# Patient Record
Sex: Male | Born: 1970 | Race: Black or African American | Hispanic: No | Marital: Single | Smoking: Current some day smoker
Health system: Southern US, Community
[De-identification: ages and names within clinical notes are randomized; demographics above are authoritative.]

## PROBLEM LIST (undated history)

## (undated) DIAGNOSIS — T7840XA Allergy, unspecified, initial encounter: Secondary | ICD-10-CM

## (undated) DIAGNOSIS — K219 Gastro-esophageal reflux disease without esophagitis: Secondary | ICD-10-CM

## (undated) HISTORY — DX: Allergy, unspecified, initial encounter: T78.40XA

## (undated) HISTORY — DX: Gastro-esophageal reflux disease without esophagitis: K21.9

---

## 2001-11-03 ENCOUNTER — Inpatient Hospital Stay (HOSPITAL_COMMUNITY): Admission: EM | Admit: 2001-11-03 | Discharge: 2001-11-04 | Payer: Self-pay | Admitting: Psychiatry

## 2007-04-30 ENCOUNTER — Emergency Department (HOSPITAL_COMMUNITY): Admission: EM | Admit: 2007-04-30 | Discharge: 2007-04-30 | Payer: Self-pay | Admitting: Family Medicine

## 2008-11-13 ENCOUNTER — Emergency Department (HOSPITAL_COMMUNITY): Admission: EM | Admit: 2008-11-13 | Discharge: 2008-11-13 | Payer: Self-pay | Admitting: Emergency Medicine
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
## 2011-08-03 ENCOUNTER — Encounter (INDEPENDENT_AMBULATORY_CARE_PROVIDER_SITE_OTHER): Payer: Self-pay | Admitting: General Surgery

## 2011-08-04 ENCOUNTER — Ambulatory Visit (INDEPENDENT_AMBULATORY_CARE_PROVIDER_SITE_OTHER): Payer: Self-pay | Admitting: General Surgery

## 2011-08-04 ENCOUNTER — Other Ambulatory Visit (INDEPENDENT_AMBULATORY_CARE_PROVIDER_SITE_OTHER): Payer: Self-pay | Admitting: General Surgery

## 2011-08-04 ENCOUNTER — Ambulatory Visit (INDEPENDENT_AMBULATORY_CARE_PROVIDER_SITE_OTHER): Payer: BC Managed Care – PPO | Admitting: General Surgery

## 2011-08-04 ENCOUNTER — Encounter (INDEPENDENT_AMBULATORY_CARE_PROVIDER_SITE_OTHER): Payer: Self-pay | Admitting: General Surgery

## 2011-08-04 VITALS — BP 115/63 | HR 62 | Temp 97.8°F | Ht 70.0 in | Wt 199.8 lb

## 2011-08-04 DIAGNOSIS — R2230 Localized swelling, mass and lump, unspecified upper limb: Secondary | ICD-10-CM

## 2011-08-04 DIAGNOSIS — R229 Localized swelling, mass and lump, unspecified: Secondary | ICD-10-CM

## 2011-08-04 MED ORDER — SULFAMETHOXAZOLE-TRIMETHOPRIM 800-160 MG PO TABS: 1.0000 | ORAL_TABLET | Freq: Two times a day (BID) | ORAL | Status: AC

## 2011-08-04 NOTE — Progress Notes (Signed)
Chief Complaint  Patient presents with  . Mass    under lt axill    HPI Warren Torres is a 40 y.o. male.  This patient is seen for evaluation of three-week history of enlarging left axillary mass. He first noticed this approximately 3 to go and states that it is increasing in size since then. The first thing he noticed with some discomfort in the area and then noticed the swelling. He has not had any recent infections. He denies any drainage or redness in the area he denies any fevers, night sweats, chills, or significant weight loss. He has lost approximately 10 pounds last year. He denies any family history of malignancy. He denies any family history of breast cancer. He was taking some protein supplements for muscle building but is no longer taking these. HPI  History reviewed. No pertinent past medical history.  History reviewed. No pertinent past surgical history.  Family History  Problem Relation Age of Onset  . Hypertension Mother     Social History History  Substance Use Topics  . Smoking status: Current Some Day Smoker    Types: Cigars  . Smokeless tobacco: Current User  . Alcohol Use: Yes    No Known Allergies  Current Outpatient Prescriptions  Medication Sig Dispense Refill  . acetaminophen (TYLENOL) 500 MG tablet Take 500 mg by mouth every 6 (six) hours as needed.        Marland Kitchen ibuprofen (ADVIL,MOTRIN) 200 MG tablet Take 200 mg by mouth every 6 (six) hours as needed.        . sulfamethoxazole-trimethoprim (SEPTRA DS) 800-160 MG per tablet Take 1 tablet by mouth 2 (two) times daily.  14 tablet  0    Review of Systems Review of Systems  Constitutional: Negative.   Eyes: Negative.   Respiratory: Negative.   Cardiovascular: Negative.   Gastrointestinal: Negative.   Genitourinary: Negative.   Musculoskeletal: Negative.   Skin: Negative.   Neurological: Positive for headaches.  Endo/Heme/Allergies: Negative.   Psychiatric/Behavioral: Negative.     Blood pressure  115/63, pulse 62, temperature 97.8 F (36.6 C), temperature source Temporal, height 5\' 10"  (1.778 m), weight 199 lb 12.8 oz (90.629 kg).  Physical Exam Physical Exam  Constitutional: He is oriented to person, place, and time. He appears well-developed and well-nourished. No distress.  HENT:  Head: Normocephalic and atraumatic.  Eyes: Conjunctivae and EOM are normal. Pupils are equal, round, and reactive to light. Right eye exhibits no discharge. Left eye exhibits no discharge. No scleral icterus.  Neck: Normal range of motion. Neck supple. No tracheal deviation present. No thyromegaly present.  Cardiovascular: Normal rate, regular rhythm and normal heart sounds.   Respiratory: Effort normal and breath sounds normal. No stridor. No respiratory distress. He has no wheezes.  GI: Soft. Bowel sounds are normal. He exhibits no distension and no mass. There is no tenderness. There is no rebound and no guarding.  Musculoskeletal: Normal range of motion. He exhibits edema.  Lymphadenopathy:       Head (right side): No submental, no submandibular, no tonsillar, no preauricular, no posterior auricular and no occipital adenopathy present.       Head (left side): No submental, no submandibular, no tonsillar, no preauricular, no posterior auricular and no occipital adenopathy present.    He has no cervical adenopathy.    He has axillary adenopathy.       Right axillary: No pectoral and no lateral adenopathy present.       Left axillary: No  pectoral and no lateral adenopathy present.      Right: No inguinal, no supraclavicular and no epitrochlear adenopathy present.       Left: No inguinal, no supraclavicular and no epitrochlear adenopathy present.       Left axillary swelling with some palpable lymphadenopathy, No sign of erythema, or fluctuance or other sign of infection.  Left breast shows a nipple piercing as well as some palpable nodules in the retroareolar position at approximately 11:00 and 1:00 but  these do feel mobile and likely gynecomastia  Right breast is without suspicious nodules or skin changes he does have a right nipple piercing as well. No right axillary lymphadenopathy  Bedside ultrasound was performed using the high-frequency probe. Demonstrates normal-appearing breast tissue in the left breast without evidence of any hypoechoic nodules or masses. In the left axilla he has a 2 cm x 3 cm area of hypo-echoic tissue as well as some 8-9 mm lymph nodes in the region.  Neurological: He is alert and oriented to person, place, and time. He has normal reflexes.  Skin: Skin is warm and dry. No rash noted. He is not diaphoretic. No erythema. No pallor.  Psychiatric: He has a normal mood and affect. His behavior is normal. Judgment and thought content normal.     Data Reviewed   Assessment/Plan    Left axillary lymphadenopathy and left breast mass. I cannot tell if this is due to infection such as early axillary abscess although he has no skin changes consistent with this. This may also be due to primary axillary mass such as lymphoma versus reactive lymphadenopathy or lymphadenopathy from her primary breast problem. I have recommended formal left breast ultrasound as well as left axillary ultrasound to further characterize these lesions. And he will followup in 2 weeks for further evaluation. If there is any suspicious findings on ultrasound we will proceed with CT scan of the chest to further evaluate. Also I have prescribed a week of Bactrim to see if this modified size of the swelling. If this is due to infection then he should receive some improvement in his symptoms with antibiotic treatment. Certainly if this is due to malignancy and he will have no change with the antibiotic management. Again, he will follow up in 2 weeks after the ultrasound for further evaluation.            Lodema Pilot DAVID 08/04/2011, 10:08 AM

## 2011-08-10 ENCOUNTER — Other Ambulatory Visit (INDEPENDENT_AMBULATORY_CARE_PROVIDER_SITE_OTHER): Payer: Self-pay | Admitting: General Surgery

## 2011-08-10 ENCOUNTER — Ambulatory Visit
Admission: RE | Admit: 2011-08-10 | Discharge: 2011-08-10 | Disposition: A | Discharge status: Home / Self Care | Payer: BC Managed Care – PPO | Source: Ambulatory Visit | Attending: General Surgery | Admitting: General Surgery

## 2011-08-10 DIAGNOSIS — R2230 Localized swelling, mass and lump, unspecified upper limb: Secondary | ICD-10-CM

## 2011-08-19 ENCOUNTER — Encounter (INDEPENDENT_AMBULATORY_CARE_PROVIDER_SITE_OTHER): Payer: BC Managed Care – PPO | Admitting: General Surgery

## 2011-09-02 ENCOUNTER — Encounter (INDEPENDENT_AMBULATORY_CARE_PROVIDER_SITE_OTHER): Payer: Self-pay | Admitting: General Surgery

## 2011-12-20 ENCOUNTER — Encounter (HOSPITAL_COMMUNITY): Payer: Self-pay

## 2011-12-20 ENCOUNTER — Emergency Department (INDEPENDENT_AMBULATORY_CARE_PROVIDER_SITE_OTHER)
Admission: EM | Admit: 2011-12-20 | Discharge: 2011-12-20 | Disposition: A | Discharge status: Home / Self Care | Payer: BC Managed Care – PPO | Source: Home / Self Care | Attending: Emergency Medicine | Admitting: Emergency Medicine

## 2011-12-20 DIAGNOSIS — J329 Chronic sinusitis, unspecified: Secondary | ICD-10-CM

## 2011-12-20 DIAGNOSIS — K0510 Chronic gingivitis, plaque induced: Secondary | ICD-10-CM

## 2011-12-20 MED ORDER — ACETAMINOPHEN-CODEINE #3 300-30 MG PO TABS: 1.0000 | ORAL_TABLET | Freq: Four times a day (QID) | ORAL | Status: AC | PRN

## 2011-12-20 MED ORDER — AMOXICILLIN 500 MG PO CAPS: 500.0000 mg | ORAL_CAPSULE | Freq: Three times a day (TID) | ORAL | Status: AC

## 2011-12-20 NOTE — ED Provider Notes (Addendum)
History     CSN: [id number]  Arrival date & time 12/20/11  1840   First MD Initiated Contact with Patient 12/20/11 1849      Chief Complaint  Patient presents with  . Facial Pain    (Consider location/radiation/quality/duration/timing/severity/associated sxs/prior treatment) HPI Comments: SINUS  PRESSURE FOR 2 WEEKS " LAST WEEK BEEN  WORSE HAD A COLD LAST WEEK, WHATEVER BUG WAS AROUND"   Patient is a 41 y.o. male presenting with sinusitis. The history is provided by the patient.  Sinusitis  This is a new problem. Episode onset: 2 WEEKS. The problem has been gradually worsening. There has been no fever. Associated symptoms include sinus pressure and sore throat. Pertinent negatives include no chills, no congestion, no swollen glands, no cough and no shortness of breath.    History reviewed. No pertinent past medical history.  History reviewed. No pertinent past surgical history.  Family History  Problem Relation Age of Onset  . Hypertension Mother     History  Substance Use Topics  . Smoking status: Current Some Day Smoker    Types: Cigars  . Smokeless tobacco: Current User  . Alcohol Use: Yes      Review of Systems  Constitutional: Negative for fever, chills, diaphoresis and fatigue.  HENT: Positive for sore throat and sinus pressure. Negative for congestion, rhinorrhea and trouble swallowing.   Respiratory: Negative for cough and shortness of breath.     Allergies  Review of patient's allergies indicates no known allergies.  Home Medications   Current Outpatient Rx  Name Route Sig Dispense Refill  . ACETAMINOPHEN 500 MG PO TABS Oral Take 500 mg by mouth every 6 (six) hours as needed.      . ACETAMINOPHEN-CODEINE #3 300-30 MG PO TABS Oral Take 1-2 tablets by mouth every 6 (six) hours as needed for pain. 15 tablet 0  . AMOXICILLIN 500 MG PO CAPS Oral Take 1 capsule (500 mg total) by mouth 3 (three) times daily. 21 capsule 0  . IBUPROFEN 200 MG PO TABS Oral  Take 200 mg by mouth every 6 (six) hours as needed.        BP 124/79  Pulse 58  Temp(Src) 98.7 F (37.1 C) (Oral)  Resp 16  SpO2 100%  Physical Exam  Nursing note and vitals reviewed. Constitutional: He appears well-developed and well-nourished. No distress.  HENT:  Head: Atraumatic.  Right Ear: Tympanic membrane normal.  Left Ear: Tympanic membrane normal.  Nose: Right sinus exhibits maxillary sinus tenderness and frontal sinus tenderness. Left sinus exhibits maxillary sinus tenderness and frontal sinus tenderness.  Mouth/Throat: Uvula is midline. Abnormal dentition. Dental caries present.  Eyes: Conjunctivae are normal. Right eye exhibits no discharge. Left eye exhibits no discharge. No scleral icterus.  Neck: Normal range of motion. Neck supple. No JVD present. No thyromegaly present.  Cardiovascular: Normal rate.   Pulmonary/Chest: Effort normal and breath sounds normal. No respiratory distress.  Abdominal: Soft. Bowel sounds are normal. He exhibits no distension. There is tenderness. There is no guarding.    ED Course  Procedures (including critical care time)  Labs Reviewed - No data to display No results found.   1. Gingivitis   2. 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

## 2011-12-20 NOTE — ED Notes (Addendum)
C/o 6 months pain and pressure in face, eyes reddened, c/o nasty brown/grey junk when he coughs (non-smoker) Called his MD 2 days ago , and has a rx for flonase that he has yet to go get from pharmacy

## 2013-05-06 ENCOUNTER — Ambulatory Visit: Payer: Self-pay | Admitting: Family Medicine

## 2013-05-06 ENCOUNTER — Ambulatory Visit: Payer: Self-pay

## 2013-05-06 VITALS — BP 107/73 | HR 78 | Temp 97.9°F | Resp 16 | Ht 71.0 in | Wt 195.0 lb

## 2013-05-06 DIAGNOSIS — M25571 Pain in right ankle and joints of right foot: Secondary | ICD-10-CM

## 2013-05-06 DIAGNOSIS — S93409A Sprain of unspecified ligament of unspecified ankle, initial encounter: Secondary | ICD-10-CM

## 2013-05-06 DIAGNOSIS — M25579 Pain in unspecified ankle and joints of unspecified foot: Secondary | ICD-10-CM

## 2013-05-06 MED ORDER — HYDROCODONE-ACETAMINOPHEN 5-325 MG PO TABS: 1.0000 | ORAL_TABLET | Freq: Four times a day (QID) | ORAL | Status: AC | PRN

## 2013-05-06 MED ORDER — IBUPROFEN 800 MG PO TABS: 800.0000 mg | ORAL_TABLET | Freq: Three times a day (TID) | ORAL | Status: AC | PRN
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
## 2013-05-06 NOTE — Patient Instructions (Signed)
Acute Ankle Sprain  with Phase I Rehab  An acute ankle sprain is a partial or complete tear in one or more of the ligaments of the ankle due to traumatic injury. The severity of the injury depends on both the the number of ligaments sprained and the grade of sprain. There are 3 grades of sprains.   · A grade 1 sprain is a mild sprain. There is a slight pull without obvious tearing. There is no loss of strength, and the muscle and ligament are the correct length.  · A grade 2 sprain is a moderate sprain. There is tearing of fibers within the substance of the ligament where it connects two bones or two cartilages. The length of the ligament is increased, and there is usually decreased strength.  · A grade 3 sprain is a complete rupture of the ligament and is uncommon.  In addition to the grade of sprain, there are three types of ankle sprains.   Lateral ankle sprains: This is a sprain of one or more of the three ligaments on the outer side (lateral) of the ankle. These are the most common sprains.  Medial ankle sprains: There is one large triangular ligament of the inner side (medial) of the ankle that is susceptible to injury. Medial ankle sprains are less common.  Syndesmosis, "high ankle," sprains: The syndesmosis is the ligament that connects the two bones of the lower leg. Syndesmosis sprains usually only occur with very severe ankle sprains.  SYMPTOMS  · Pain, tenderness, and swelling in the ankle, starting at the side of injury that may progress to the whole ankle and foot with time.  · "Pop" or tearing sensation at the time of injury.  · Bruising that may spread to the heel.  · Impaired ability to walk soon after injury.  CAUSES   · Acute ankle sprains are caused by trauma placed on the ankle that temporarily forces or pries the anklebone (talus) out of its normal socket.  · Stretching or tearing of the ligaments that normally hold the joint in place (usually due to a twisting injury).  RISK INCREASES  WITH:  · Previous ankle sprain.  · Sports in which the foot may land awkwardly (ie. basketball, volleyball, or soccer) or walking or running on uneven or rough surfaces.  · Shoes with inadequate support to prevent sideways motion when stress occurs.  · Poor strength and flexibility.  · Poor balance skills.  · Contact sports.  PREVENTION   · Warm up and stretch properly before activity.  · Maintain physical fitness:  · Ankle and leg flexibility, muscle strength, and endurance.  · Cardiovascular fitness.  · Balance training activities.  · Use proper technique and have a coach correct improper technique.  · Taping, protective strapping, bracing, or high-top tennis shoes may help prevent injury. Initially, tape is best; however, it loses most of its support function within 10 to 15 minutes.  · Wear proper fitted protective shoes (High-top shoes with taping or bracing is more effective than either alone).  · Provide the ankle with support during sports and practice activities for 12 months following injury.  PROGNOSIS   · If treated properly, ankle sprains can be expected to recover completely; however, the length of recovery depends on the degree of injury.  · A grade 1 sprain usually heals enough in 5 to 7 days to allow modified activity and requires an average of 6 weeks to heal completely.  · A grade 2 sprain requires   6 to 10 weeks to heal completely.  · A grade 3 sprain requires 12 to 16 weeks to heal.  · A syndesmosis sprain often takes more than 3 months to heal.  RELATED COMPLICATIONS   · Frequent recurrence of symptoms may result in a chronic problem. Appropriately addressing the problem the first time decreases the frequency of recurrence and optimizes healing time. Severity of the initial sprain does not predict the likelihood of later instability.  · Injury to other structures (bone, cartilage, or tendon).  · A chronically unstable or arthritic ankle joint is a possiblity with repeated  sprains.  TREATMENT  Treatment initially involves the use of ice, medication, and compression bandages to help reduce pain and inflammation. Ankle sprains are usually immobilized in a walking cast or boot to allow for healing. Crutches may be recommended to reduce pressure on the injury. After immobilization, strengthening and stretching exercises may be necessary to regain strength and a full range of motion. Surgery is rarely needed to treat ankle sprains.  MEDICATION   · Nonsteroidal anti-inflammatory medications, such as aspirin and ibuprofen (do not take for the first 3 days after injury or within 7 days before surgery), or other minor pain relievers, such as acetaminophen, are often recommended. Take these as directed by your caregiver. Contact your caregiver immediately if any bleeding, stomach upset, or signs of an allergic reaction occur from these medications.  · Ointments applied to the skin may be helpful.  · Pain relievers may be prescribed as necessary by your caregiver. Do not take prescription pain medication for longer than 4 to 7 days. Use only as directed and only as much as you need.  HEAT AND COLD  · Cold treatment (icing) is used to relieve pain and reduce inflammation for acute and chronic cases. Cold should be applied for 10 to 15 minutes every 2 to 3 hours for inflammation and pain and immediately after any activity that aggravates your symptoms. Use ice packs or an ice massage.  · Heat treatment may be used before performing stretching and strengthening activities prescribed by your caregiver. Use a heat pack or a warm soak.  SEEK IMMEDIATE MEDICAL CARE IF:   · Pain, swelling, or bruising worsens despite treatment.  · You experience pain, numbness, discoloration, or coldness in the foot or toes.  · New, unexplained symptoms develop (drugs used in treatment may produce side effects.)  EXERCISES   PHASE I EXERCISES  RANGE OF MOTION (ROM) AND STRETCHING EXERCISES - Ankle Sprain, Acute Phase I,  Weeks 1 to 2  These exercises may help you when beginning to restore flexibility in your ankle. You will likely work on these exercises for the 1 to 2 weeks after your injury. Once your physician, physical therapist, or athletic trainer sees adequate progress, he or she will advance your exercises. While completing these exercises, remember:   · Restoring tissue flexibility helps normal motion to return to the joints. This allows healthier, less painful movement and activity.  · An effective stretch should be held for at least 30 seconds.  · A stretch should never be painful. You should only feel a gentle lengthening or release in the stretched tissue.  RANGE OF MOTION - Dorsi/Plantar Flexion  · While sitting with your right / left knee straight, draw the top of your foot upwards by flexing your ankle. Then reverse the motion, pointing your toes downward.  · Hold each position for __________ seconds.  · After completing your first set of   exercises, repeat this exercise with your knee bent.  Repeat __________ times. Complete this exercise __________ times per day.   RANGE OF MOTION - Ankle Alphabet  · Imagine your right / left big toe is a pen.  · Keeping your hip and knee still, write out the entire alphabet with your "pen." Make the letters as large as you can without increasing any discomfort.  Repeat __________ times. Complete this exercise __________ times per day.   STRENGTHENING EXERCISES - Ankle Sprain, Acute -Phase I, Weeks 1 to 2  These exercises may help you when beginning to restore strength in your ankle. You will likely work on these exercises for 1 to 2 weeks after your injury. Once your physician, physical therapist, or athletic trainer sees adequate progress, he or she will advance your exercises. While completing these exercises, remember:   · Muscles can gain both the endurance and the strength needed for everyday activities through controlled exercises.  · Complete these exercises as instructed by  your physician, physical therapist, or athletic trainer. Progress the resistance and repetitions only as guided.  · You may experience muscle soreness or fatigue, but the pain or discomfort you are trying to eliminate should never worsen during these exercises. If this pain does worsen, stop and make certain you are following the directions exactly. If the pain is still present after adjustments, discontinue the exercise until you can discuss the trouble with your clinician.  STRENGTH - Dorsiflexors  · Secure a rubber exercise band/tubing to a fixed object (ie. table, pole) and loop the other end around your right / left foot.  · Sit on the floor facing the fixed object. The band/tubing should be slightly tense when your foot is relaxed.  · Slowly draw your foot back toward you using your ankle and toes.  · Hold this position for __________ seconds. Slowly release the tension in the band and return your foot to the starting position.  Repeat __________ times. Complete this exercise __________ times per day.   STRENGTH - Plantar-flexors   · Sit with your right / left leg extended. Holding onto both ends of a rubber exercise band/tubing, loop it around the ball of your foot. Keep a slight tension in the band.  · Slowly push your toes away from you, pointing them downward.  · Hold this position for __________ seconds. Return slowly, controlling the tension in the band/tubing.  Repeat __________ times. Complete this exercise __________ times per day.   STRENGTH - Ankle Eversion  · Secure one end of a rubber exercise band/tubing to a fixed object (table, pole). Loop the other end around your foot just before your toes.  · Place your fists between your knees. This will focus your strengthening at your ankle.  · Drawing the band/tubing across your opposite foot, slowly, pull your little toe out and up. Make sure the band/tubing is positioned to resist the entire motion.  · Hold this position for __________ seconds.  Have  your muscles resist the band/tubing as it slowly pulls your foot back to the starting position.   Repeat __________ times. Complete this exercise __________ times per day.   STRENGTH - Ankle Inversion  · Secure one end of a rubber exercise band/tubing to a fixed object (table, pole). Loop the other end around your foot just before your toes.  · Place your fists between your knees. This will focus your strengthening at your ankle.  · Slowly, pull your big toe up and in, making   sure the band/tubing is positioned to resist the entire motion.  · Hold this position for __________ seconds.  · Have your muscles resist the band/tubing as it slowly pulls your foot back to the starting position.  Repeat __________ times. Complete this exercises __________ times per day.   STRENGTH - Towel Curls  · Sit in a chair positioned on a non-carpeted surface.  · Place your right / left foot on a towel, keeping your heel on the floor.  · Pull the towel toward your heel by only curling your toes. Keep your heel on the floor.  · If instructed by your physician, physical therapist, or athletic trainer, add weight to the end of the towel.  Repeat __________ times. Complete this exercise __________ times per day.  Document Released: 06/23/2005 Document Revised: 02/14/2012 Document Reviewed: 03/06/2009  [employer]® Patient Information ©2014 [employer].

## 2019-10-12 ENCOUNTER — Other Ambulatory Visit: Payer: Self-pay | Admitting: Internal Medicine

## 2019-10-12 DIAGNOSIS — Z13220 Encounter for screening for lipoid disorders: Secondary | ICD-10-CM | POA: Diagnosis not present

## 2019-10-12 DIAGNOSIS — J309 Allergic rhinitis, unspecified: Secondary | ICD-10-CM | POA: Diagnosis not present

## 2019-10-12 DIAGNOSIS — N62 Hypertrophy of breast: Secondary | ICD-10-CM

## 2019-10-12 DIAGNOSIS — Z0000 Encounter for general adult medical examination without abnormal findings: Secondary | ICD-10-CM | POA: Diagnosis not present

## 2019-10-23 ENCOUNTER — Other Ambulatory Visit: Payer: Self-pay

## 2019-10-30 ENCOUNTER — Other Ambulatory Visit: Payer: Self-pay

## 2019-11-09 ENCOUNTER — Ambulatory Visit: Payer: Self-pay

## 2019-11-09 ENCOUNTER — Ambulatory Visit
Admission: RE | Admit: 2019-11-09 | Discharge: 2019-11-09 | Disposition: A | Discharge status: Home / Self Care | Payer: BC Managed Care – PPO | Source: Ambulatory Visit | Attending: Internal Medicine | Admitting: Internal Medicine

## 2019-11-09 ENCOUNTER — Other Ambulatory Visit: Payer: Self-pay | Admitting: Internal Medicine

## 2019-11-09 ENCOUNTER — Other Ambulatory Visit: Payer: Self-pay

## 2019-11-09 DIAGNOSIS — R2232 Localized swelling, mass and lump, left upper limb: Secondary | ICD-10-CM

## 2019-11-09 DIAGNOSIS — N62 Hypertrophy of breast: Secondary | ICD-10-CM

## 2019-11-09 DIAGNOSIS — R928 Other abnormal and inconclusive findings on diagnostic imaging of breast: Secondary | ICD-10-CM | POA: Diagnosis not present

## 2020-01-04 ENCOUNTER — Other Ambulatory Visit: Payer: Self-pay | Admitting: Surgery

## 2020-01-04 ENCOUNTER — Ambulatory Visit: Payer: Self-pay | Admitting: Surgery

## 2020-01-04 DIAGNOSIS — N62 Hypertrophy of breast: Secondary | ICD-10-CM | POA: Diagnosis not present

## 2020-01-04 NOTE — H&P (Signed)
History of Present Illness Warren Torres. Warren Hassan MD; 01/04/2020 12:09 PM) The patient is a 49 year old male who presents with gynecomastia. Referred by Dr. Chales Torres for bilateral gynecomastia  This is a 49 year old male who presents with several years of visible gynecomastia but recent noticeable enlargement bilaterally as well as associated tenderness. When the patient was younger he used to work out aggressively and also used anabolic steroids. He is no longer use of steroids and actually does not work out much anymore. Over the last several months he has noticed enlargement of both breasts as well as significant associated tenderness. He is actually unable to sleep on his chest. The underwent imaging that showed normal appearing breast tissue with no discrete mass. The patient does admit to frequent headaches. He also has history of frequent nosebleeds but these have been less common recently. He also feels that his vision is deteriorating.  In 2012, he underwent core biopsy of an enlarged left axillary lymph node. 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  CLINICAL DATA: Patient reports palpable tender abnormalities in the retroareolar regions bilaterally, increasing in frequency over the last several months. There is also questioned nodularity in the LEFT axilla on recent physical exam. 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  EXAM: DIGITAL DIAGNOSTIC BILATERAL MAMMOGRAM WITH CAD AND TOMO  ULTRASOUND LEFT BREAST  COMPARISON: LEFT diagnostic mammogram performed 08/10/2011  ACR Breast Density Category b: There are scattered areas of fibroglandular density.  FINDINGS: Bilaterally, symmetric there is mild retroareolar fibroglandular tissue in appearance. No mass, distortion, or suspicious microcalcifications. Evaluation of the LEFT axilla is negative for adenopathy  mammographically.  Mammographic images were processed with CAD.  On physical exam, I palpate no discrete mass in the LEFT axilla. I palpate soft fullness in the retroareolar regions bilaterally, not associated with mass.  Targeted ultrasound is performed, showing normal appearing LEFT axillary contents. No adenopathy identified.  IMPRESSION: 1. Bilateral symmetric gynecomastia. This is causing the patient moderate discomfort. We discussed the option of surgical consultation regarding possible mastectomy. The patient is interested in consultation, and this will be arranged. 2. No evidence for adenopathy or malignancy.  RECOMMENDATION: 1. Clinical follow-up as needed. 2. Surgical consultation to discuss options for gynecomastia.  I have discussed the findings and recommendations with the patient. If applicable, a reminder letter will be sent to the patient regarding the next appointment.  BI-RADS CATEGORY 2: Benign.  Electronically Signed: By: Warren Torres M.D. On: 11/09/2019 16:32   Past Surgical History Warren Torres, CMA; 01/04/2020 10:03 AM) No pertinent past surgical history  Diagnostic Studies History Warren Torres, [place]; 01/04/2020 10:03 AM) Colonoscopy never  Allergies Warren Torres, CMA; 01/04/2020 10:04 AM) No Known Drug Allergies [01/04/2020]: Allergies Reconciled  Medication History Warren Torres, CMA; 01/04/2020 10:04 AM) No Current Medications Medications Reconciled  Social History Warren Torres, [place]; 01/04/2020 10:03 AM) Alcohol use Occasional alcohol use. Tobacco use Current some day smoker.  Family History Warren Torres, [place]; 01/04/2020 10:03 AM) Cancer Sister. Hypertension Mother.     Review of Systems Warren Torres CMA; 01/04/2020 10:03 AM) Skin Not Present- Change in Wart/Mole, Dryness, Hives, Jaundice, New Lesions, Non-Healing Wounds, Rash and Ulcer. HEENT Not Present- Earache, Hearing Loss, Hoarseness, Nose  Bleed, Oral Ulcers, Ringing in the Ears, Seasonal Allergies, Sinus Pain, Sore Throat, Visual Disturbances, Wears glasses/contact lenses and Yellow Eyes. Breast Present- Breast Mass and Breast Pain. Not Present- Nipple Discharge and Skin Changes. Cardiovascular Present- Chest  Pain. Not Present- Difficulty Breathing Lying Down, Leg Cramps, Palpitations, Rapid Heart Rate, Shortness of Breath and Swelling of Extremities. Gastrointestinal Not Present- Abdominal Pain, Bloating, Bloody Stool, Change in Bowel Habits, Chronic diarrhea, Constipation, Difficulty Swallowing, Excessive gas, Gets full quickly at meals, Hemorrhoids, Indigestion, Nausea, Rectal Pain and Vomiting. Male Genitourinary Not Present- Blood in Urine, Change in Urinary Stream, Frequency, Impotence, Nocturia, Painful Urination, Urgency and Urine Leakage.  Vitals Warren Torres CMA; 01/04/2020 10:04 AM) 01/04/2020 10:03 AM Weight: 190.8 lb Height: 68in Body Surface Area: 2 m Body Mass Index: 29.01 kg/m  Temp.: 97.47F  Pulse: 81 (Regular)  BP: 126/80 (Sitting, Left Arm, Standard)        Physical Exam Warren Key K. Aroldo Galli MD; 01/04/2020 12:10 PM)  The physical exam findings are as follows: Note:WDWN in NAD Eyes: Pupils equal, round; sclera anicteric HENT: Oral mucosa moist; good dentition Neck: No masses palpated, no thyromegaly Lungs: CTA bilaterally; normal respiratory effort Chest: obvious bilateral gynecomastia - left side larger than right. Tender to palpation diffusely. No discrete masses. No nipple discharge. No axillary lymphadenopathy CV: Regular rate and rhythm; no murmurs; extremities well-perfused with no edema Abd: +bowel sounds, soft, non-tender, no palpable organomegaly; no palpable hernias Skin: Warm, dry; no sign of jaundice Psychiatric - alert and oriented x 4; calm mood and affect    Assessment & Plan Warren Key K. Jaxon Flatt MD; 01/04/2020 12:12 PM)  GYNECOMASTIA (N62) Impression: Bilateral  enlarging, tender  Current Plans CAT SCAN OF HEAD/BRAIN W/O DYE (29518) Schedule for Surgery - if the head CT is negative for a prolactinoma, we will proceed with scheduling bilateral subcutaneous mastectomies. The surgical procedure has been discussed with the patient. Potential risks, benefits, alternative treatments, and expected outcomes have been explained. All of the patient's questions at this time have been answered. The likelihood of reaching the patient's treatment goal is good. The patient understand the proposed surgical procedure and wishes to proceed. Note:With the enlarging bilateral tender gynecomastia, as well as the frequent headaches, history of frequent nosebleeds, and visual deterioration, I would first like to obtain a CT scan to rule out a pituitary tumor. If this is negative, we will proceed with bilateral subcutaneous mastectomies. I believe this is indicated because of the enlarging size and the diffuse tenderness. This is not a purely cosmetic procedure. I went ahead and discussed the surgical procedure with the patient but we will not schedule surgery until we have the results of the CT scan.  Imogene Burn. Georgette Dover, MD, [facility]  General/ Trauma Surgery   01/04/2020 12:12 PM

## 2020-01-16 ENCOUNTER — Ambulatory Visit
Admission: RE | Admit: 2020-01-16 | Discharge: 2020-01-16 | Disposition: A | Discharge status: Home / Self Care | Payer: BC Managed Care – PPO | Source: Ambulatory Visit | Attending: Surgery | Admitting: Surgery

## 2020-01-16 DIAGNOSIS — R519 Headache, unspecified: Secondary | ICD-10-CM | POA: Diagnosis not present

## 2020-01-16 DIAGNOSIS — N62 Hypertrophy of breast: Secondary | ICD-10-CM

## 2020-01-17 ENCOUNTER — Ambulatory Visit: Payer: Self-pay | Admitting: Surgery

## 2020-01-17 NOTE — Progress Notes (Signed)
Please call the patient and let them know that their CT scan showed no sign of pituitary tumor.  We can proceed with scheduling surgery.  I think the orders are in already.

## 2020-01-22 DIAGNOSIS — E785 Hyperlipidemia, unspecified: Secondary | ICD-10-CM | POA: Diagnosis not present

## 2020-02-05 DIAGNOSIS — R6882 Decreased libido: Secondary | ICD-10-CM | POA: Diagnosis not present

## 2020-02-05 DIAGNOSIS — R5383 Other fatigue: Secondary | ICD-10-CM | POA: Diagnosis not present

## 2020-02-05 DIAGNOSIS — E291 Testicular hypofunction: Secondary | ICD-10-CM | POA: Diagnosis not present

## 2020-02-05 DIAGNOSIS — E785 Hyperlipidemia, unspecified: Secondary | ICD-10-CM | POA: Diagnosis not present

## 2020-02-05 DIAGNOSIS — N644 Mastodynia: Secondary | ICD-10-CM | POA: Diagnosis not present

## 2020-02-05 DIAGNOSIS — J309 Allergic rhinitis, unspecified: Secondary | ICD-10-CM | POA: Diagnosis not present

## 2020-02-05 DIAGNOSIS — N62 Hypertrophy of breast: Secondary | ICD-10-CM | POA: Diagnosis not present

## 2020-02-06 DIAGNOSIS — R6882 Decreased libido: Secondary | ICD-10-CM | POA: Diagnosis not present

## 2020-02-06 DIAGNOSIS — R5383 Other fatigue: Secondary | ICD-10-CM | POA: Diagnosis not present

## 2020-02-06 DIAGNOSIS — N62 Hypertrophy of breast: Secondary | ICD-10-CM | POA: Diagnosis not present

## 2020-02-06 DIAGNOSIS — N644 Mastodynia: Secondary | ICD-10-CM | POA: Diagnosis not present

## 2020-03-26 DIAGNOSIS — N62 Hypertrophy of breast: Secondary | ICD-10-CM | POA: Diagnosis not present

## 2020-03-26 DIAGNOSIS — N644 Mastodynia: Secondary | ICD-10-CM | POA: Diagnosis not present

## 2020-10-06 DIAGNOSIS — N644 Mastodynia: Secondary | ICD-10-CM | POA: Diagnosis not present

## 2020-10-06 DIAGNOSIS — Z720 Tobacco use: Secondary | ICD-10-CM | POA: Diagnosis not present

## 2020-10-06 DIAGNOSIS — N528 Other male erectile dysfunction: Secondary | ICD-10-CM | POA: Diagnosis not present

## 2020-10-06 DIAGNOSIS — N62 Hypertrophy of breast: Secondary | ICD-10-CM | POA: Diagnosis not present

## 2020-10-17 DIAGNOSIS — Z13220 Encounter for screening for lipoid disorders: Secondary | ICD-10-CM | POA: Diagnosis not present

## 2020-10-17 DIAGNOSIS — Z0000 Encounter for general adult medical examination without abnormal findings: Secondary | ICD-10-CM | POA: Diagnosis not present

## 2020-11-14 DIAGNOSIS — F332 Major depressive disorder, recurrent severe without psychotic features: Secondary | ICD-10-CM | POA: Diagnosis not present

## 2021-04-17 DIAGNOSIS — Z20828 Contact with and (suspected) exposure to other viral communicable diseases: Secondary | ICD-10-CM | POA: Diagnosis not present

## 2021-08-25 DIAGNOSIS — M9903 Segmental and somatic dysfunction of lumbar region: Secondary | ICD-10-CM | POA: Diagnosis not present

## 2021-08-25 DIAGNOSIS — M545 Low back pain, unspecified: Secondary | ICD-10-CM | POA: Diagnosis not present

## 2021-08-25 DIAGNOSIS — M9902 Segmental and somatic dysfunction of thoracic region: Secondary | ICD-10-CM | POA: Diagnosis not present

## 2021-08-25 DIAGNOSIS — M9904 Segmental and somatic dysfunction of sacral region: Secondary | ICD-10-CM | POA: Diagnosis not present

## 2021-09-17 IMAGING — US US AXILLARY LEFT
1 series · 3 of 3 positions shown · non-contrast
Comparison: LEFT diagnostic mammogram performed 08/10/2011
COMPARISON: LEFT diagnostic mammogram performed 08/10/2011

Addendum:
CLINICAL DATA: Patient reports palpable tender abnormalities in the
retroareolar regions bilaterally, increasing in frequency over the
last several months. There is also questioned nodularity in the LEFT
axilla on recent physical exam. The patient's history of biopsy
proven necrotizing granulomatous lymphadenitis of the LEFT axilla in
5345.

EXAM:
DIGITAL DIAGNOSTIC BILATERAL MAMMOGRAM WITH CAD AND TOMO
ULTRASOUND LEFT BREAST

[Series 1: us axillary left · 0.06mm/px · 3 of 3 slices shown]
[im 1/3]
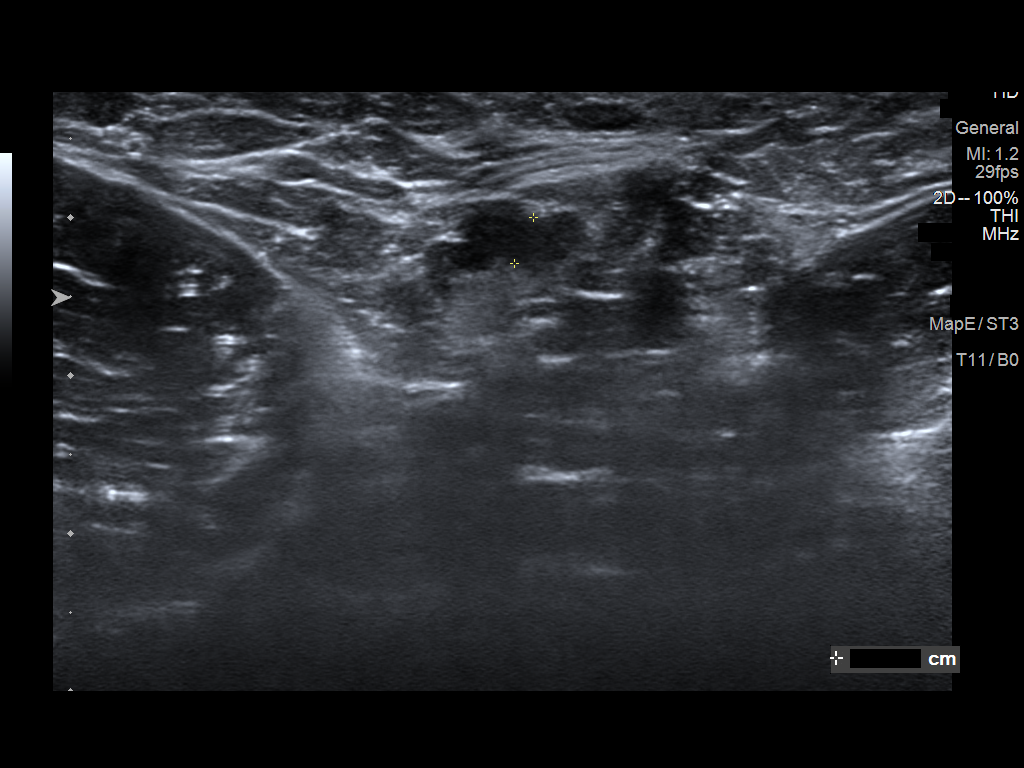
[im 2/3]
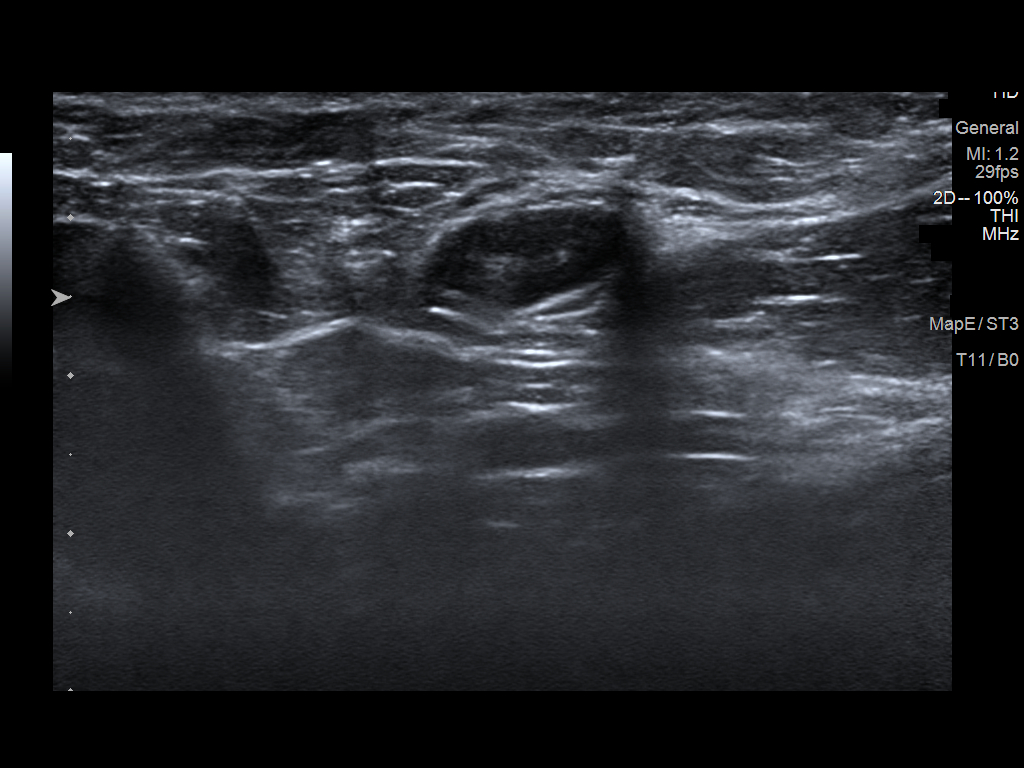
[im 3/3]
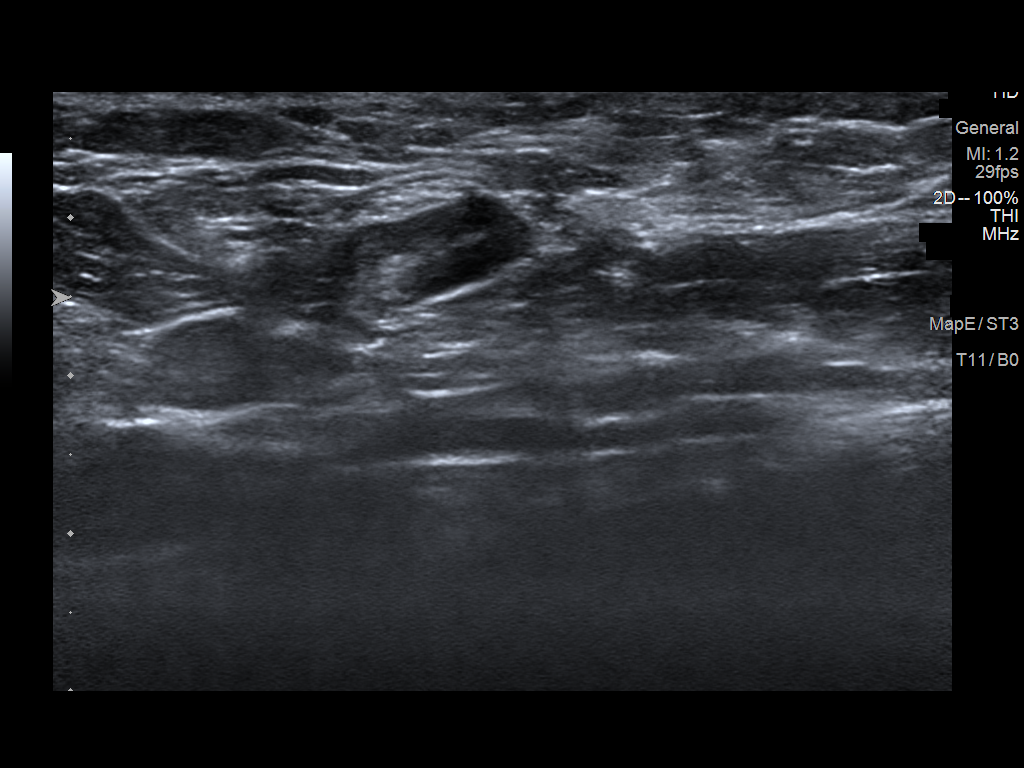

[3 of 3 positions shown; findings below may reference images not displayed]

ACR Breast Density Category b: There are scattered areas of
fibroglandular density.
FINDINGS: Bilaterally, symmetric there is mild retroareolar fibroglandular
tissue in appearance. No mass, distortion, or suspicious
microcalcifications. Evaluation of the LEFT axilla is negative for
adenopathy mammographically.

Mammographic images were processed with CAD.

On physical exam, I palpate no discrete mass in the LEFT axilla. I
palpate soft fullness in the retroareolar regions bilaterally, not
associated with mass.

Targeted ultrasound is performed, showing normal appearing LEFT
axillary contents. No adenopathy identified.
IMPRESSION: 1. Bilateral symmetric gynecomastia. This is causing the patient
moderate discomfort. We discussed the option of surgical
consultation regarding possible mastectomy. The patient is
interested in consultation, and this will be arranged.
2. No evidence for adenopathy or malignancy.

RECOMMENDATION:
1. Clinical follow-up as needed.
2. Surgical consultation to discuss options for gynecomastia.

I have discussed the findings and recommendations with the patient.
If applicable, a reminder letter will be sent to the patient
regarding the next appointment.

BI-RADS CATEGORY  2: Benign.

ADDENDUM:
Surgical consultation has been arranged with Dr. Nikunj Susanti at
[REDACTED] on December 14, 2019

Pozisa Tiger, RN on 11/12/2019.

*** End of Addendum ***
ACR Breast Density Category b: There are scattered areas of
fibroglandular density.
FINDINGS: Bilaterally, symmetric there is mild retroareolar fibroglandular
tissue in appearance. No mass, distortion, or suspicious
microcalcifications. Evaluation of the LEFT axilla is negative for
adenopathy mammographically.

Mammographic images were processed with CAD.

On physical exam, I palpate no discrete mass in the LEFT axilla. I
palpate soft fullness in the retroareolar regions bilaterally, not
associated with mass.

Targeted ultrasound is performed, showing normal appearing LEFT
axillary contents. No adenopathy identified.
IMPRESSION: 1. Bilateral symmetric gynecomastia. This is causing the patient
moderate discomfort. We discussed the option of surgical
consultation regarding possible mastectomy. The patient is
interested in consultation, and this will be arranged.
2. No evidence for adenopathy or malignancy.

RECOMMENDATION:
1. Clinical follow-up as needed.
2. Surgical consultation to discuss options for gynecomastia.

I have discussed the findings and recommendations with the patient.
If applicable, a reminder letter will be sent to the patient
regarding the next appointment.

BI-RADS CATEGORY  2: Benign.

## 2021-09-17 IMAGING — MG DIGITAL DIAGNOSTIC BILAT W/ TOMO W/ CAD
6 of 12 series · 6 of 36 positions shown · non-contrast
Comparison: LEFT diagnostic mammogram performed 08/10/2011
COMPARISON: LEFT diagnostic mammogram performed 08/10/2011

Addendum:
CLINICAL DATA: Patient reports palpable tender abnormalities in the
retroareolar regions bilaterally, increasing in frequency over the
last several months. There is also questioned nodularity in the LEFT
axilla on recent physical exam. The patient's history of biopsy
proven necrotizing granulomatous lymphadenitis of the LEFT axilla in
5345.

EXAM:
DIGITAL DIAGNOSTIC BILATERAL MAMMOGRAM WITH CAD AND TOMO
ULTRASOUND LEFT BREAST

[L MLO synth-2D]
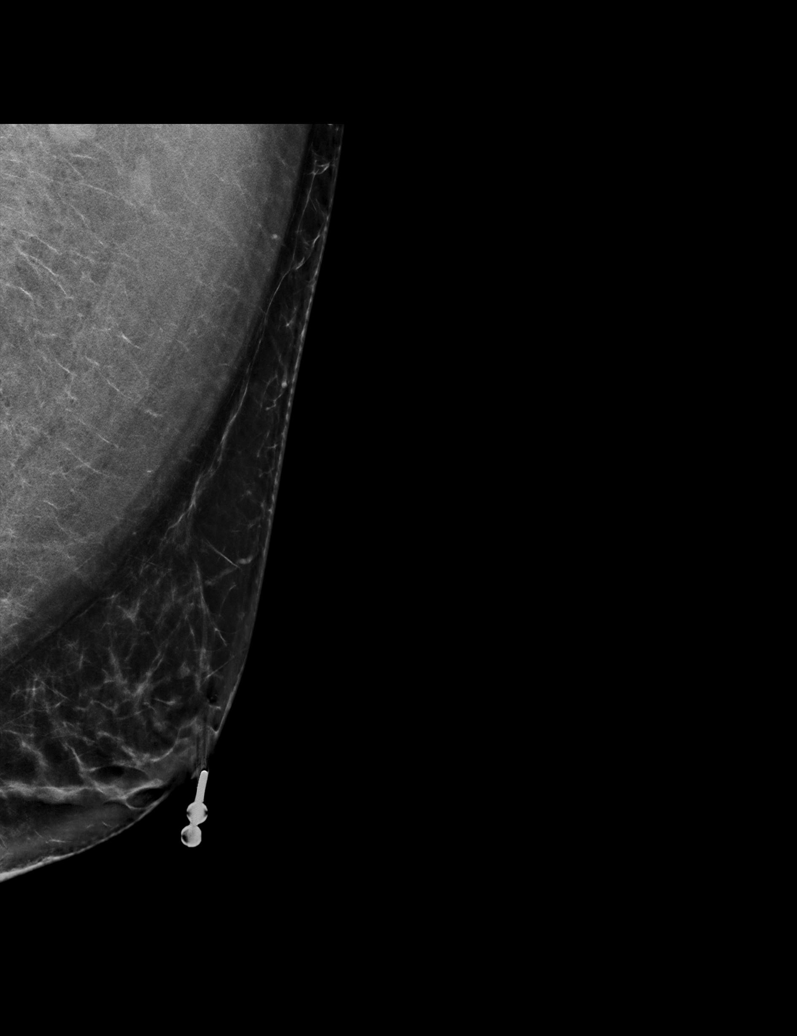

[R CC synth-2D (1 of 2)]
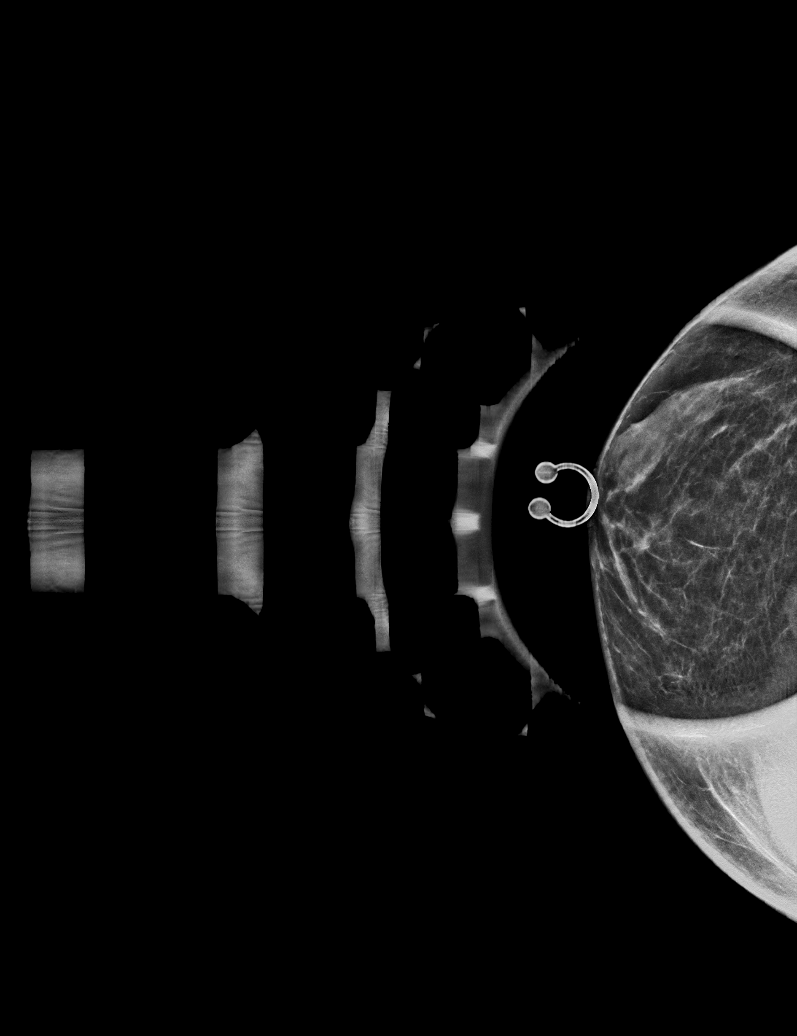

[L CC synth-2D (1 of 2)]
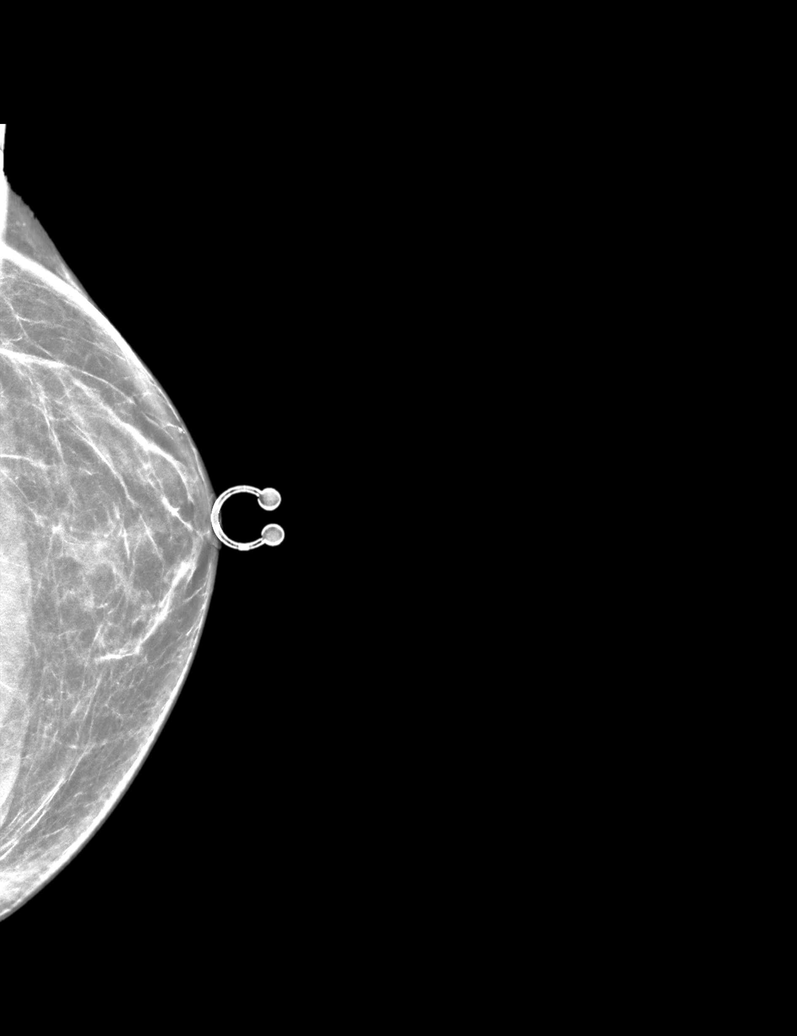

[L CC synth-2D (2 of 2)]
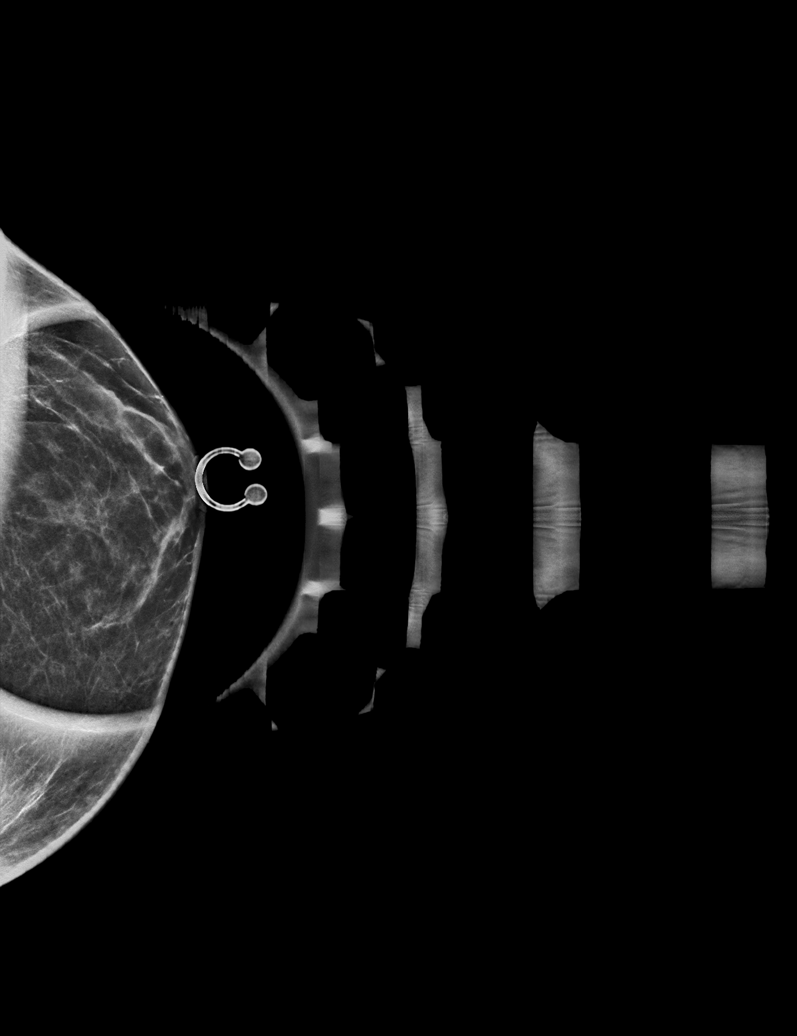

[R MLO synth-2D]
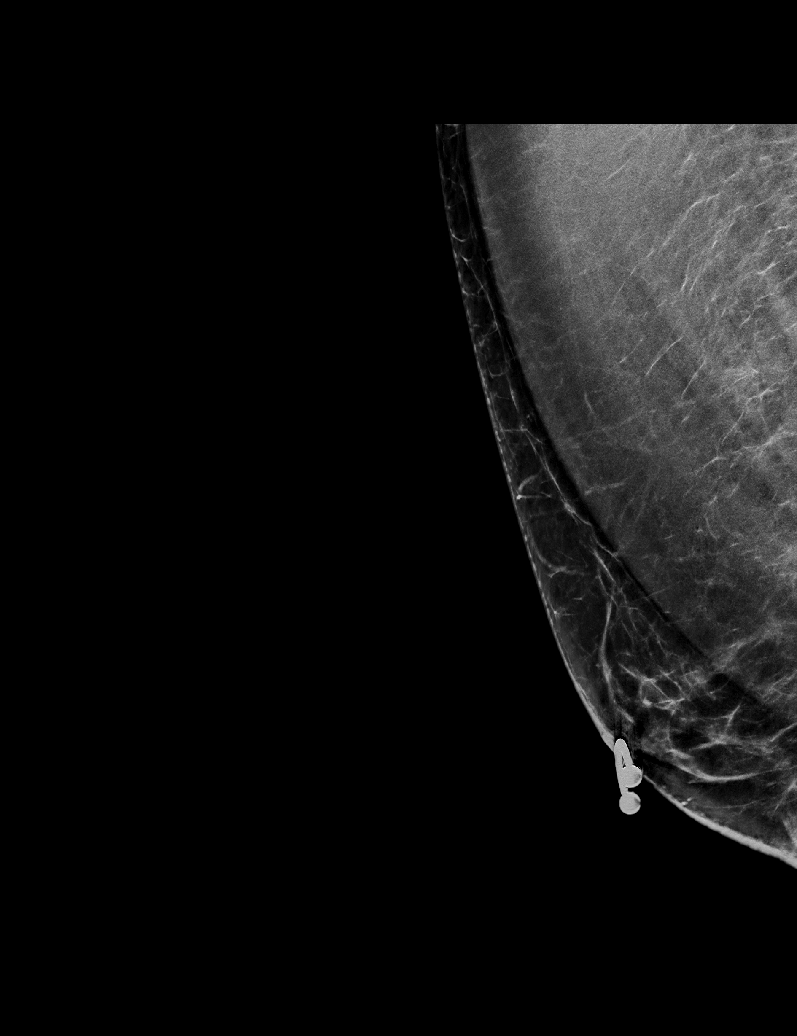

[R CC synth-2D (2 of 2)]
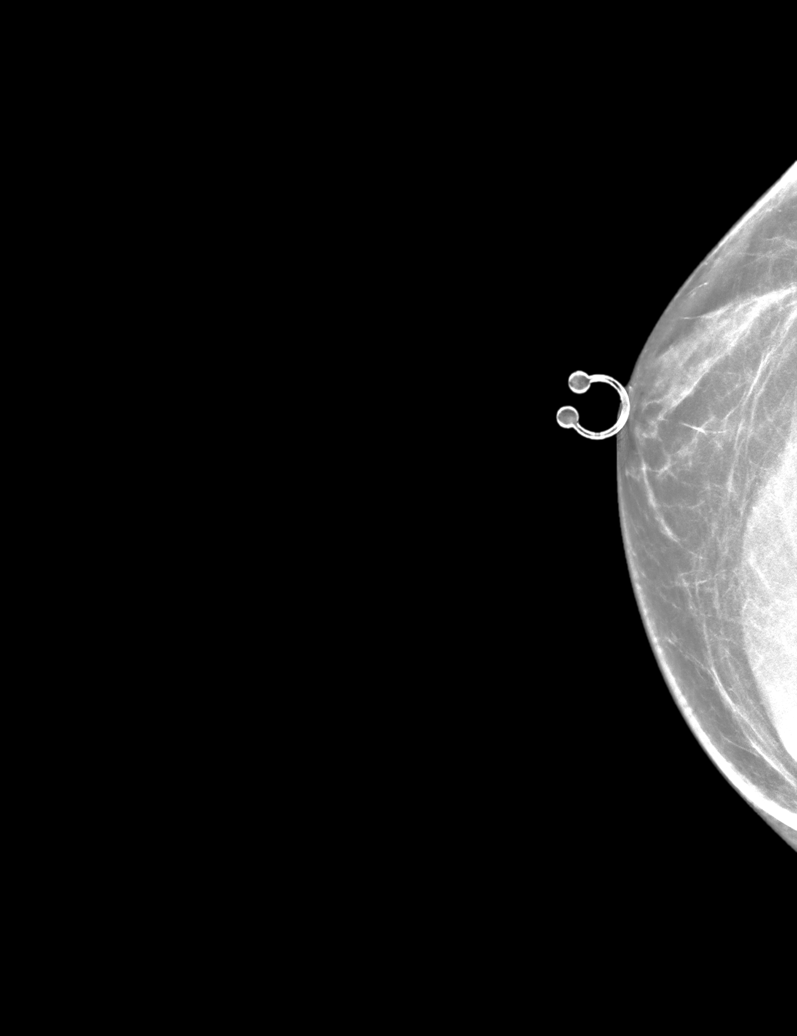

[6 of 36 positions shown; findings below may reference images not displayed]

ACR Breast Density Category b: There are scattered areas of
fibroglandular density.
FINDINGS: Bilaterally, symmetric there is mild retroareolar fibroglandular
tissue in appearance. No mass, distortion, or suspicious
microcalcifications. Evaluation of the LEFT axilla is negative for
adenopathy mammographically.

Mammographic images were processed with CAD.

On physical exam, I palpate no discrete mass in the LEFT axilla. I
palpate soft fullness in the retroareolar regions bilaterally, not
associated with mass.

Targeted ultrasound is performed, showing normal appearing LEFT
axillary contents. No adenopathy identified.
IMPRESSION: 1. Bilateral symmetric gynecomastia. This is causing the patient
moderate discomfort. We discussed the option of surgical
consultation regarding possible mastectomy. The patient is
interested in consultation, and this will be arranged.
2. No evidence for adenopathy or malignancy.

RECOMMENDATION:
1. Clinical follow-up as needed.
2. Surgical consultation to discuss options for gynecomastia.

I have discussed the findings and recommendations with the patient.
If applicable, a reminder letter will be sent to the patient
regarding the next appointment.

BI-RADS CATEGORY  2: Benign.

ADDENDUM:
Surgical consultation has been arranged with Dr. Nikunj Susanti at
[REDACTED] on December 14, 2019

Pozisa Tiger, RN on 11/12/2019.

*** End of Addendum ***
ACR Breast Density Category b: There are scattered areas of
fibroglandular density.
FINDINGS: Bilaterally, symmetric there is mild retroareolar fibroglandular
tissue in appearance. No mass, distortion, or suspicious
microcalcifications. Evaluation of the LEFT axilla is negative for
adenopathy mammographically.

Mammographic images were processed with CAD.

On physical exam, I palpate no discrete mass in the LEFT axilla. I
palpate soft fullness in the retroareolar regions bilaterally, not
associated with mass.

Targeted ultrasound is performed, showing normal appearing LEFT
axillary contents. No adenopathy identified.
IMPRESSION: 1. Bilateral symmetric gynecomastia. This is causing the patient
moderate discomfort. We discussed the option of surgical
consultation regarding possible mastectomy. The patient is
interested in consultation, and this will be arranged.
2. No evidence for adenopathy or malignancy.

RECOMMENDATION:
1. Clinical follow-up as needed.
2. Surgical consultation to discuss options for gynecomastia.

I have discussed the findings and recommendations with the patient.
If applicable, a reminder letter will be sent to the patient
regarding the next appointment.

BI-RADS CATEGORY  2: Benign.

## 2021-11-24 IMAGING — CT CT HEAD W/O CM
1 series · 16 of 30 positions shown, 20 images · non-contrast
Comparison: None.

CLINICAL DATA: Headaches and altered vision. Developing
gynecomastia

EXAM:
CT HEAD WITHOUT CONTRAST
TECHNIQUE: Contiguous axial images were obtained from the base of the skull
through the vertex without intravenous contrast.

[Series 2: head w/(date) · axial · 0.49mm/px · z∈[-193,-33]mm · 16 of 36 slices shown, 20 images]
[im 2/36  brain]
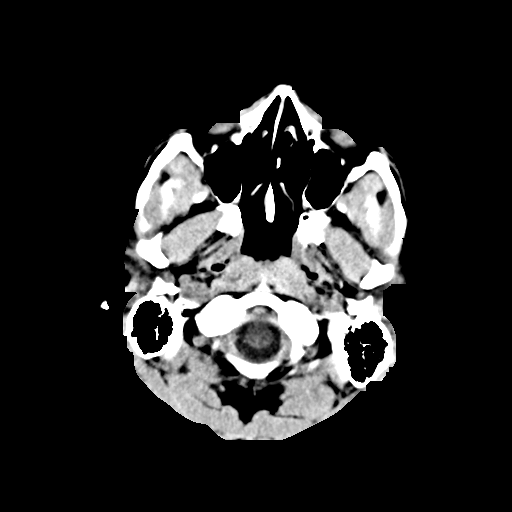
[im 2/36  bone]
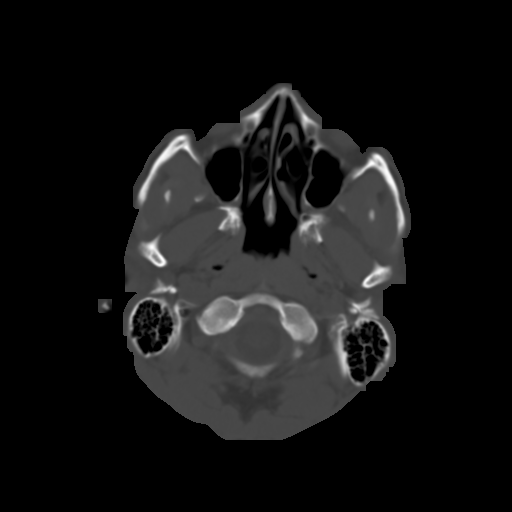
[im 4/36  brain]
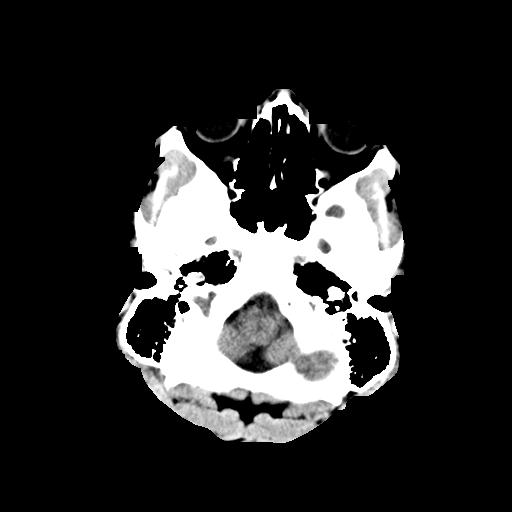
[im 7/36  brain]
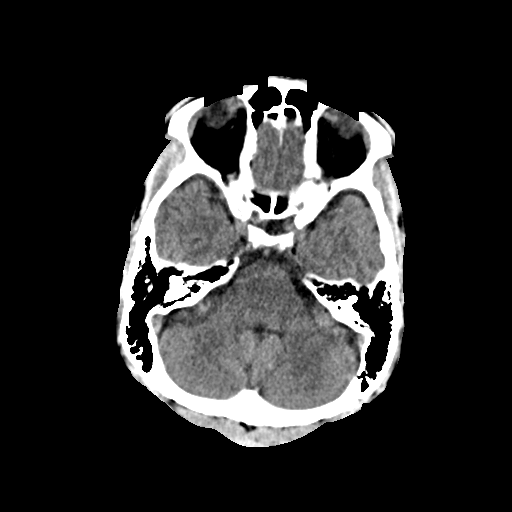
[im 9/36  brain]
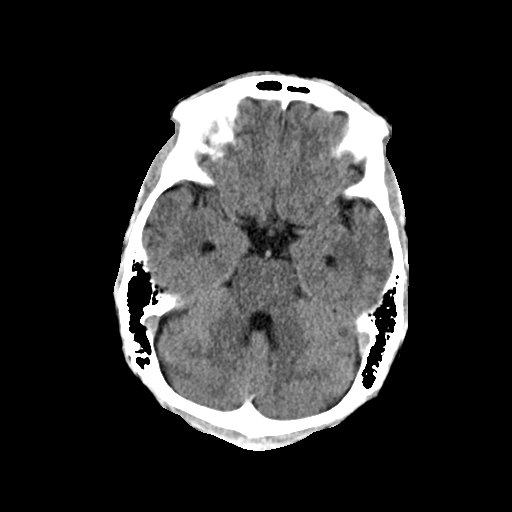
[im 10/36  brain]
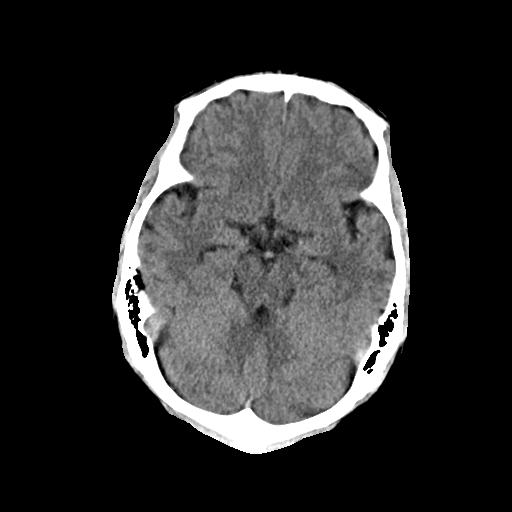
[im 10/36  bone]
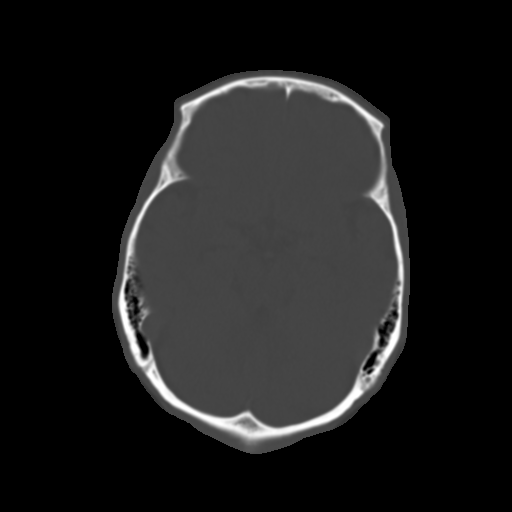
[im 13/36  brain]
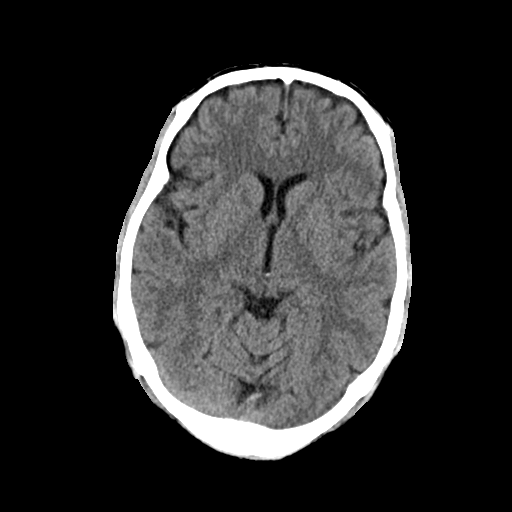
[im 15/36  brain]
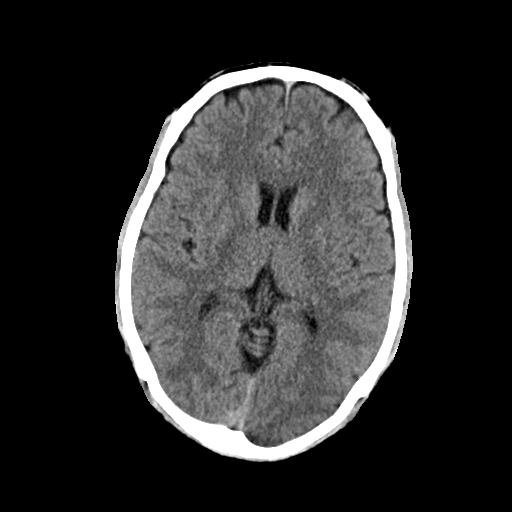
[im 17/36  brain]
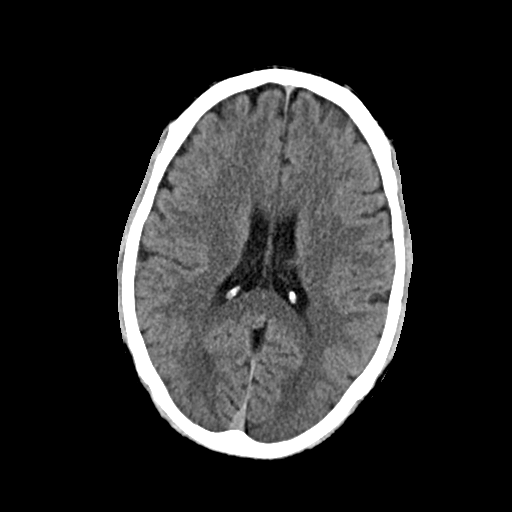
[im 19/36  brain]
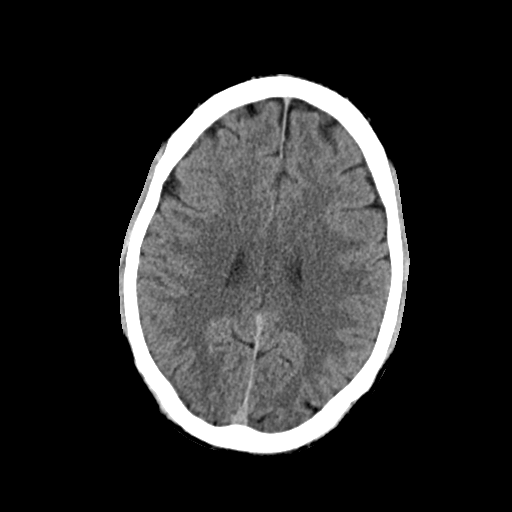
[im 19/36  bone]
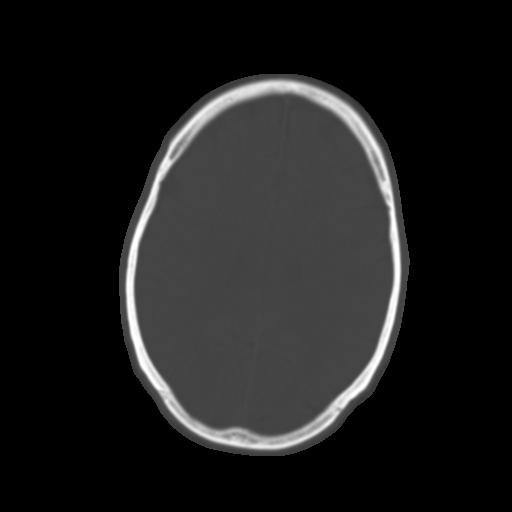
[im 21/36  brain]
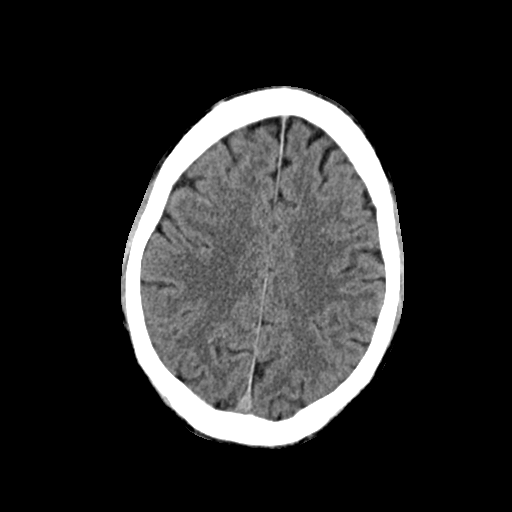
[im 23/36  brain]
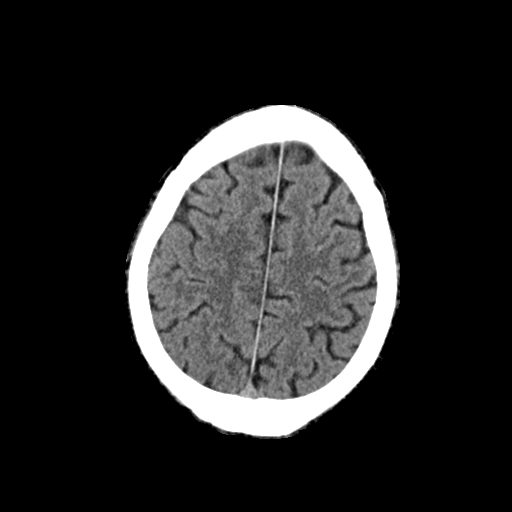
[im 26/36  brain]
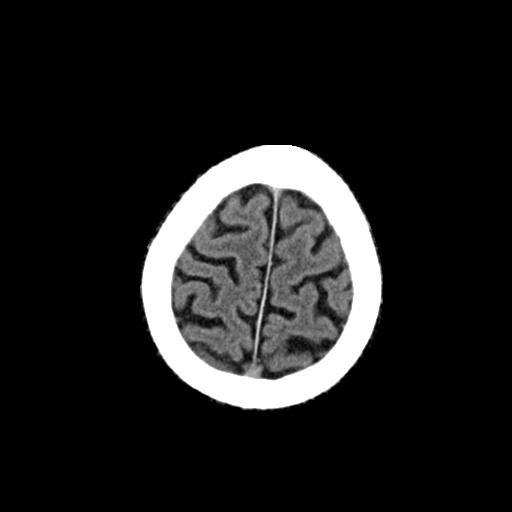
[im 27/36  brain]
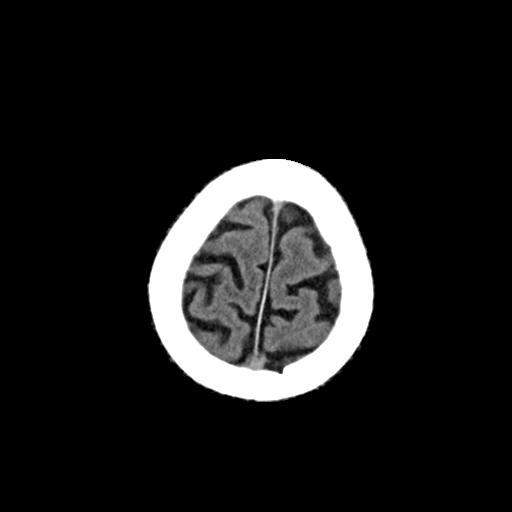
[im 27/36  bone]
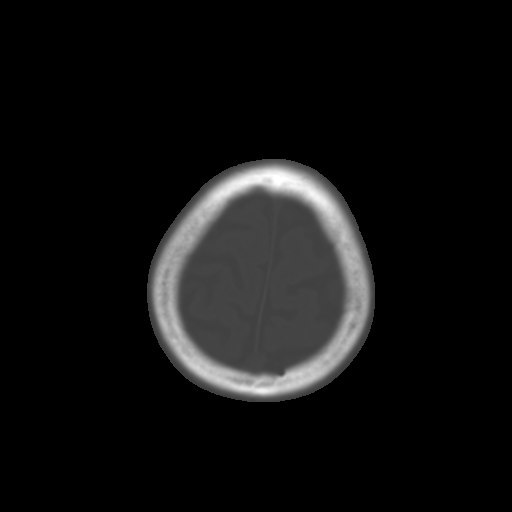
[im 29/36  brain]
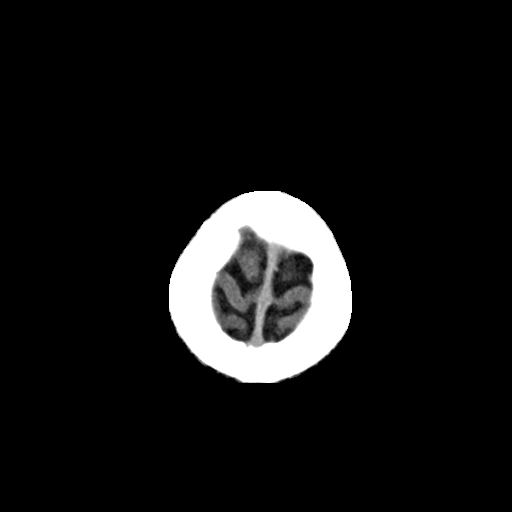
[im 32/36  brain]
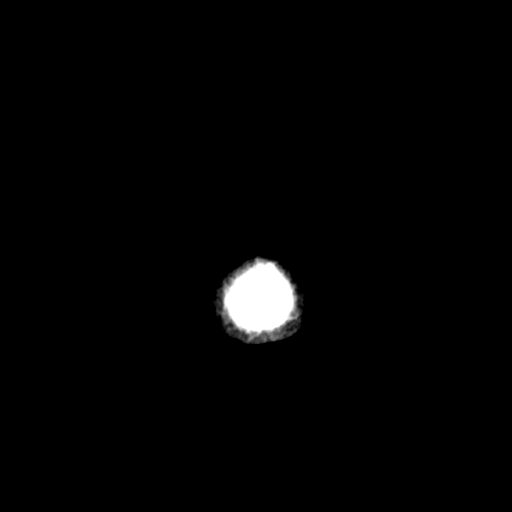
[im 34/36  brain]
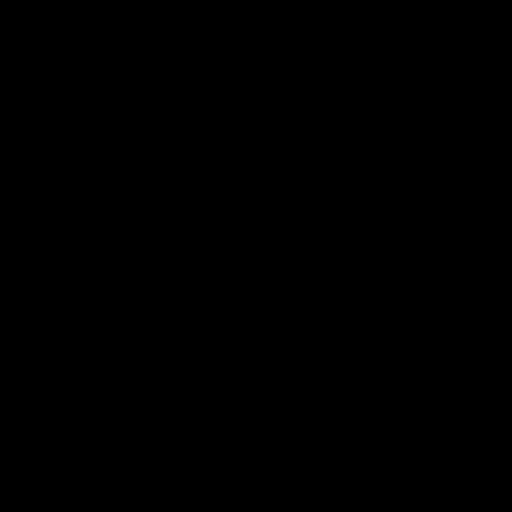

[16 of 30 positions shown; findings below may reference images not displayed]

FINDINGS: Brain: Ventricles and sulci are normal in size and configuration.
The pituitary gland does not appear enlarged by CT. There is no
intracranial mass, hemorrhage, extra-axial fluid collection, or
midline shift. Brain parenchyma appears unremarkable. No
demonstrable acute infarct.

Vascular: No hyperdense vessel.  No evident vascular calcification.

Skull: The bony calvarium appears intact.

Sinuses/Orbits: Visualized paranasal sinuses are clear. Orbits
appear symmetric bilaterally.

Other: Visualized mastoid air cells are clear.
IMPRESSION: Study within normal limits.

No pituitary lesion is evident by noncontrast enhanced CT
examination. If there remains concern for potential subcentimeter
pituitary lesion, dedicated MR of the sella pre and post-contrast
advised to further evaluate.

## 2021-12-05 ENCOUNTER — Emergency Department (HOSPITAL_COMMUNITY)
Admission: EM | Admit: 2021-12-05 | Discharge: 2021-12-05 | Disposition: A | Discharge status: Home / Self Care | Payer: BC Managed Care – PPO | Attending: Emergency Medicine | Admitting: Emergency Medicine

## 2021-12-05 ENCOUNTER — Other Ambulatory Visit: Payer: Self-pay

## 2021-12-05 ENCOUNTER — Encounter (HOSPITAL_COMMUNITY): Payer: Self-pay | Admitting: *Deleted

## 2021-12-05 DIAGNOSIS — X503XXA Overexertion from repetitive movements, initial encounter: Secondary | ICD-10-CM | POA: Diagnosis not present

## 2021-12-05 DIAGNOSIS — S39012A Strain of muscle, fascia and tendon of lower back, initial encounter: Secondary | ICD-10-CM | POA: Insufficient documentation

## 2021-12-05 DIAGNOSIS — F17290 Nicotine dependence, other tobacco product, uncomplicated: Secondary | ICD-10-CM | POA: Insufficient documentation

## 2021-12-05 DIAGNOSIS — S3992XA Unspecified injury of lower back, initial encounter: Secondary | ICD-10-CM | POA: Diagnosis present

## 2021-12-05 MED ORDER — PREDNISONE 10 MG PO TABS: 30.0000 mg | ORAL_TABLET | Freq: Every day | ORAL | 0 refills | Status: AC

## 2021-12-05 MED ORDER — KETOROLAC TROMETHAMINE 60 MG/2ML IM SOLN
30.0000 mg | Freq: Once | INTRAMUSCULAR | Status: AC
  Administered 2021-12-05: 30 mg via INTRAMUSCULAR
  Filled 2021-12-05: qty 2

## 2021-12-05 MED ORDER — CYCLOBENZAPRINE HCL 10 MG PO TABS: 10.0000 mg | ORAL_TABLET | Freq: Two times a day (BID) | ORAL | 0 refills | Status: AC | PRN

## 2021-12-05 NOTE — Discharge Instructions (Signed)
You have been seen here for back.  I have given you steroids and muscle relaxers please take as prescribed.  I recommend taking over-the-counter pain medications like ibuprofen and/or Tylenol every 6 as needed.  Please follow dosage and on the back of bottle.  I also recommend applying heat to the area and stretching out the muscles as this will help decrease stiffness and pain.  I have given you information on exercises please follow..  Follow-up with your PCP in 2 to 3 weeks if symptoms not fully improved.  Come back to the emergency department if you develop chest pain, shortness of breath, severe abdominal pain, uncontrolled nausea, vomiting, diarrhea.

## 2021-12-05 NOTE — ED Provider Notes (Signed)
[facility] EMERGENCY DEPARTMENT Provider Note   CSN: [id number] Arrival date & time: 12/05/21  1138     History Chief Complaint  Patient presents with   Back Pain    Warren Torres is a 50 y.o. male.  HPI  Patient without significant medical history presents to the emergency department with chief complaint of left lower back pain.  Patient's pain started yesterday, states that the day prior he was out changing tires on his car and  yesterday he then started to wash his car and later that evening he started to have this back pain.  States the pain remains in his left lower back and was slightly into his left buttocks, he denies any paresthesias or weakness moving down his legs, denies saddle paresthesias, urinary incontinency, retention, difficult bowel movements, he denies systemic infection like fevers, chills, chest pain, shortness of breath, peripheral edema.  He has no history of IV drug use, he denies having chronic pain, states been taking over-the-counter pain medication without much relief, he is unable to find a comfortable position, movement tends make the pain worse.  He has no other complaints at this time.   Past Medical History:  Diagnosis Date   Allergy    GERD (gastroesophageal reflux disease)     There are no problems to display for this patient.   History reviewed. No pertinent surgical history.     Family History  Problem Relation Age of Onset   Hypertension Mother     Social History   Tobacco Use   Smoking status: Some Days    Types: Cigars   Smokeless tobacco: Current  Substance Use Topics   Alcohol use: No   Drug use: No    Home Medications Prior to Admission medications   Medication Sig Start Date End Date Taking? Authorizing Provider  cyclobenzaprine (FLEXERIL) 10 MG tablet Take 1 tablet (10 mg total) by mouth 2 (two) times daily as needed for muscle spasms. 12/05/21  Yes Carroll Sage, PA-C  predniSONE (DELTASONE) 10 MG tablet Take  3 tablets (30 mg total) by mouth daily for 5 days. 12/05/21 12/10/21 Yes Carroll Sage, PA-C  acetaminophen (TYLENOL) 500 MG tablet Take 500 mg by mouth every 6 (six) hours as needed.      [provider]  HYDROcodone-acetaminophen (NORCO) 5-325 MG per tablet Take 1 tablet by mouth every 6 (six) hours as needed for pain. 05/06/13   Sherren Mocha, MD  ibuprofen (ADVIL,MOTRIN) 800 MG tablet Take 1 tablet (800 mg total) by mouth every 8 (eight) hours as needed for pain. 05/06/13   Sherren Mocha, MD    Allergies    Patient has no known allergies.  Review of Systems   Review of Systems  Constitutional:  Negative for chills and fever.  HENT:  Negative for congestion.   Respiratory:  Negative for shortness of breath.   Cardiovascular:  Negative for chest pain.  Gastrointestinal:  Negative for abdominal pain.  Genitourinary:  Negative for enuresis.  Musculoskeletal:  Positive for back pain.  Skin:  Negative for rash.  Neurological:  Negative for dizziness.  Hematological:  Does not bruise/bleed easily.   Physical Exam Updated Vital Signs BP 120/81 (BP Location: Right Arm)    Pulse 79    Temp 97.9 F (36.6 C) (Oral)    Resp 14    Ht 5\' 9"  (1.753 m)    Wt 83.9 kg    SpO2 98%    BMI 27.32 kg/m  Physical Exam Vitals and nursing note reviewed.  Constitutional:      General: He is not in acute distress.    Appearance: He is not ill-appearing.  HENT:     Head: Normocephalic and atraumatic.     Nose: No congestion.  Eyes:     Conjunctiva/sclera: Conjunctivae normal.  Cardiovascular:     Rate and Rhythm: Normal rate and regular rhythm.     Pulses: Normal pulses.     Heart sounds: No murmur heard.   No friction rub. No gallop.  Pulmonary:     Effort: No respiratory distress.     Breath sounds: No wheezing, rhonchi or rales.  Abdominal:     Palpations: Abdomen is soft.     Tenderness: There is no abdominal tenderness. There is no right CVA tenderness or left CVA tenderness.   Musculoskeletal:     Comments: Spine was palpated nontender to palpation, no step-off deformities present, there is no overlying skin changes noted, he has tenderness noted within the musculature surrounding the iliac crest, he has positive straight leg raise on the left side, he has 5-5 strength neuro vas intact in lower extremities, he has 2+ Achilles tendon reflexes.  Skin:    General: Skin is warm and dry.  Neurological:     Mental Status: He is alert.  Psychiatric:        Mood and Affect: Mood normal.    ED Results / Procedures / Treatments   Labs (all labs ordered are listed, but only abnormal results are displayed) Labs Reviewed - No data to display  EKG None  Radiology No results found.  Procedures Procedures   Medications Ordered in ED Medications  ketorolac (TORADOL) injection 30 mg (30 mg Intramuscular Given 12/05/21 1225)    ED Course  I have reviewed the triage vital signs and the nursing notes.  Pertinent labs & imaging results that were available during my care of the patient were reviewed by me and considered in my medical decision making (see chart for details).    MDM Rules/Calculators/A&P                         Initial impression-presents with lower back pain.  Alert, no acute stress, vital signs reassuring.  Will provide patient with Toradol.  Work-up-due to well-appearing patient, benign physical exam, further lab work and imaging are not warranted at this time.  Rule out- I have low suspicion for spinal fracture or spinal cord abnormality as patient denies urinary incontinency, retention, difficulty with bowel movements, denies saddle paresthesias.  Spine was palpated there is no step-off, crepitus or gross deformities felt, patient had 5/5 strength, full range of motion, neurovascular fully intact in the lower extremities.  Imaging will be deferred as there is no traumatic injury associated with this pain, he is at risk for osteoporosis, I have low  suspicion for fractures at this time.  I. Low suspicion for septic arthritis as patient denies IV drug use, skin exam was performed no erythematous, edema or warm joints noted.  low suspicion for AAA and or dissection as presentation atypical etiology, nontoxic-appearing, vital signs reassuring, no documented history of aneurysms or dissections.  Low suspicion for UTI, Pilo, kidney stone as he denies any urinary symptoms, has no flank tenderness, no CVA tenderness noted.   Plan-  Back pain-likely muscular strain, will start him on short course of steroids, muscle relaxers, recommend over-the-counter pain medications, follow-up with PCP as needed.  Vital  signs have remained stable, no indication for hospital admission.  Patient discussed with attending and they agreed with assessment and plan.  Patient given at home care as well strict return precautions.  Patient verbalized that they understood agreed to said plan.     Final Clinical Impression(s) / ED Diagnoses Final diagnoses:  Strain of lumbar region, initial encounter    Rx / DC Orders ED Discharge Orders          Ordered    cyclobenzaprine (FLEXERIL) 10 MG tablet  2 times daily PRN        12/05/21 1230    predniSONE (DELTASONE) 10 MG tablet  Daily        12/05/21 1230             Barnie Del 12/05/21 1231    Bethann Berkshire, MD 12/08/21 1010

## 2021-12-05 NOTE — ED Triage Notes (Signed)
Pt with lower back pain that radiates down right leg.  Hx of same and seen by chiropractor.

## 2022-09-06 ENCOUNTER — Emergency Department (HOSPITAL_COMMUNITY)
Admission: EM | Admit: 2022-09-06 | Discharge: 2022-09-06 | Disposition: A | Discharge status: Home / Self Care | Payer: Self-pay | Attending: Emergency Medicine | Admitting: Emergency Medicine

## 2022-09-06 ENCOUNTER — Emergency Department (HOSPITAL_COMMUNITY): Payer: Self-pay

## 2022-09-06 ENCOUNTER — Other Ambulatory Visit: Payer: Self-pay

## 2022-09-06 ENCOUNTER — Encounter (HOSPITAL_COMMUNITY): Payer: Self-pay

## 2022-09-06 DIAGNOSIS — Y9301 Activity, walking, marching and hiking: Secondary | ICD-10-CM | POA: Insufficient documentation

## 2022-09-06 DIAGNOSIS — F17290 Nicotine dependence, other tobacco product, uncomplicated: Secondary | ICD-10-CM | POA: Insufficient documentation

## 2022-09-06 DIAGNOSIS — S4992XA Unspecified injury of left shoulder and upper arm, initial encounter: Secondary | ICD-10-CM | POA: Diagnosis not present

## 2022-09-06 MED ORDER — NAPROXEN 250 MG PO TABS
500.0000 mg | ORAL_TABLET | Freq: Once | ORAL | Status: AC
  Administered 2022-09-06: 500 mg via ORAL
  Filled 2022-09-06: qty 2

## 2022-09-06 MED ORDER — NAPROXEN 500 MG PO TABS: 500.0000 mg | ORAL_TABLET | Freq: Two times a day (BID) | ORAL | 0 refills | Status: AC
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
## 2022-09-06 NOTE — Discharge Instructions (Signed)
You were evaluated in the Emergency Department and after careful evaluation, we did not find any emergent condition requiring admission or further testing in the hospital.  Your exam/testing today is overall reassuring.  X-rays did not show any broken bones.  If you continue to have sensation issues in the left arm recommend follow-up with your primary care doctor.  Recommend using the Naprosyn anti-inflammatory for pain.  Please return to the Emergency Department if you experience any worsening of your condition.   Thank you for allowing [place] to be a part of your care.

## 2023-04-05 DIAGNOSIS — M79605 Pain in left leg: Secondary | ICD-10-CM | POA: Diagnosis not present

## 2023-04-05 DIAGNOSIS — Z139 Encounter for screening, unspecified: Secondary | ICD-10-CM | POA: Diagnosis not present

## 2023-04-05 DIAGNOSIS — E785 Hyperlipidemia, unspecified: Secondary | ICD-10-CM | POA: Diagnosis not present

## 2023-04-05 DIAGNOSIS — N400 Enlarged prostate without lower urinary tract symptoms: Secondary | ICD-10-CM | POA: Diagnosis not present

## 2023-05-03 DIAGNOSIS — Z139 Encounter for screening, unspecified: Secondary | ICD-10-CM | POA: Diagnosis not present

## 2023-05-03 DIAGNOSIS — E785 Hyperlipidemia, unspecified: Secondary | ICD-10-CM | POA: Diagnosis not present

## 2023-05-03 DIAGNOSIS — N400 Enlarged prostate without lower urinary tract symptoms: Secondary | ICD-10-CM | POA: Diagnosis not present

## 2023-05-16 DIAGNOSIS — M545 Low back pain, unspecified: Secondary | ICD-10-CM | POA: Diagnosis not present

## 2023-05-16 DIAGNOSIS — R7401 Elevation of levels of liver transaminase levels: Secondary | ICD-10-CM | POA: Diagnosis not present

## 2023-05-16 DIAGNOSIS — N400 Enlarged prostate without lower urinary tract symptoms: Secondary | ICD-10-CM | POA: Diagnosis not present

## 2023-05-16 DIAGNOSIS — N529 Male erectile dysfunction, unspecified: Secondary | ICD-10-CM | POA: Diagnosis not present

## 2023-05-16 DIAGNOSIS — Z0001 Encounter for general adult medical examination with abnormal findings: Secondary | ICD-10-CM | POA: Diagnosis not present

## 2023-05-19 ENCOUNTER — Encounter (INDEPENDENT_AMBULATORY_CARE_PROVIDER_SITE_OTHER): Payer: Self-pay | Admitting: *Deleted

## 2023-10-03 DIAGNOSIS — F418 Other specified anxiety disorders: Secondary | ICD-10-CM | POA: Diagnosis not present

## 2023-10-03 DIAGNOSIS — R059 Cough, unspecified: Secondary | ICD-10-CM | POA: Diagnosis not present

## 2023-10-03 DIAGNOSIS — N529 Male erectile dysfunction, unspecified: Secondary | ICD-10-CM | POA: Diagnosis not present

## 2023-10-03 DIAGNOSIS — F5221 Male erectile disorder: Secondary | ICD-10-CM | POA: Diagnosis not present

## 2023-10-03 DIAGNOSIS — M545 Low back pain, unspecified: Secondary | ICD-10-CM | POA: Diagnosis not present

## 2023-11-14 DIAGNOSIS — R7301 Impaired fasting glucose: Secondary | ICD-10-CM | POA: Diagnosis not present

## 2023-11-14 DIAGNOSIS — E785 Hyperlipidemia, unspecified: Secondary | ICD-10-CM | POA: Diagnosis not present

## 2023-11-14 DIAGNOSIS — F5221 Male erectile disorder: Secondary | ICD-10-CM | POA: Diagnosis not present

## 2023-11-14 DIAGNOSIS — N400 Enlarged prostate without lower urinary tract symptoms: Secondary | ICD-10-CM | POA: Diagnosis not present

## 2023-11-14 DIAGNOSIS — R7401 Elevation of levels of liver transaminase levels: Secondary | ICD-10-CM | POA: Diagnosis not present

## 2023-11-21 ENCOUNTER — Encounter (INDEPENDENT_AMBULATORY_CARE_PROVIDER_SITE_OTHER): Payer: Self-pay | Admitting: *Deleted

## 2024-05-21 DIAGNOSIS — E782 Mixed hyperlipidemia: Secondary | ICD-10-CM | POA: Diagnosis not present

## 2024-05-21 DIAGNOSIS — R7301 Impaired fasting glucose: Secondary | ICD-10-CM | POA: Diagnosis not present

## 2024-06-25 DIAGNOSIS — E785 Hyperlipidemia, unspecified: Secondary | ICD-10-CM | POA: Diagnosis not present

## 2024-06-25 DIAGNOSIS — G8929 Other chronic pain: Secondary | ICD-10-CM | POA: Diagnosis not present

## 2024-06-25 DIAGNOSIS — R7301 Impaired fasting glucose: Secondary | ICD-10-CM | POA: Diagnosis not present

## 2024-06-25 DIAGNOSIS — M545 Low back pain, unspecified: Secondary | ICD-10-CM | POA: Diagnosis not present

## 2024-06-25 DIAGNOSIS — F418 Other specified anxiety disorders: Secondary | ICD-10-CM | POA: Diagnosis not present

## 2024-06-25 DIAGNOSIS — F5221 Male erectile disorder: Secondary | ICD-10-CM | POA: Diagnosis not present

## 2024-06-26 ENCOUNTER — Encounter (INDEPENDENT_AMBULATORY_CARE_PROVIDER_SITE_OTHER): Payer: Self-pay | Admitting: *Deleted

## 2024-12-27 ENCOUNTER — Encounter (INDEPENDENT_AMBULATORY_CARE_PROVIDER_SITE_OTHER): Payer: Self-pay | Admitting: *Deleted
